# Patient Record
Sex: Female | Born: 1995 | Race: Black or African American | Hispanic: No | Marital: Single | Smoking: Never smoker
Health system: Southern US, Community
[De-identification: ages and names within clinical notes are randomized; demographics above are authoritative.]

## PROBLEM LIST (undated history)

## (undated) HISTORY — PX: OTHER SURGICAL HISTORY: SHX169

---

## 2017-03-01 ENCOUNTER — Emergency Department (HOSPITAL_COMMUNITY)
Admission: EM | Admit: 2017-03-01 | Discharge: 2017-03-01 | Disposition: A | Discharge status: Home / Self Care | Payer: BLUE CROSS/BLUE SHIELD | Attending: Emergency Medicine | Admitting: Emergency Medicine

## 2017-03-01 ENCOUNTER — Emergency Department (HOSPITAL_COMMUNITY): Payer: BLUE CROSS/BLUE SHIELD

## 2017-03-01 ENCOUNTER — Other Ambulatory Visit: Payer: Self-pay

## 2017-03-01 ENCOUNTER — Encounter (HOSPITAL_COMMUNITY): Payer: Self-pay | Admitting: *Deleted

## 2017-03-01 DIAGNOSIS — M79645 Pain in left finger(s): Secondary | ICD-10-CM | POA: Diagnosis not present

## 2017-03-01 DIAGNOSIS — M25561 Pain in right knee: Secondary | ICD-10-CM | POA: Diagnosis not present

## 2017-03-01 DIAGNOSIS — Y92410 Unspecified street and highway as the place of occurrence of the external cause: Secondary | ICD-10-CM | POA: Insufficient documentation

## 2017-03-01 DIAGNOSIS — Y999 Unspecified external cause status: Secondary | ICD-10-CM | POA: Insufficient documentation

## 2017-03-01 DIAGNOSIS — M542 Cervicalgia: Secondary | ICD-10-CM | POA: Insufficient documentation

## 2017-03-01 DIAGNOSIS — Y9389 Activity, other specified: Secondary | ICD-10-CM | POA: Insufficient documentation

## 2017-03-01 DIAGNOSIS — S199XXA Unspecified injury of neck, initial encounter: Secondary | ICD-10-CM | POA: Diagnosis present

## 2017-03-01 DIAGNOSIS — R51 Headache: Secondary | ICD-10-CM | POA: Insufficient documentation

## 2017-03-01 LAB — POC URINE PREG, ED: Preg Test, Ur: NEGATIVE

## 2017-03-01 MED ORDER — IBUPROFEN 600 MG PO TABS: 600.0000 mg | ORAL_TABLET | Freq: Four times a day (QID) | ORAL | 0 refills | Status: AC | PRN

## 2017-03-01 MED ORDER — CYCLOBENZAPRINE HCL 5 MG PO TABS: 5.0000 mg | ORAL_TABLET | Freq: Two times a day (BID) | ORAL | 0 refills | Status: AC | PRN

## 2017-03-01 MED ORDER — ACETAMINOPHEN 325 MG PO TABS
650.0000 mg | ORAL_TABLET | Freq: Once | ORAL | Status: AC
  Administered 2017-03-01: 650 mg via ORAL
  Filled 2017-03-01: qty 2

## 2017-03-01 NOTE — ED Notes (Signed)
Pt placed in c-collar
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
## 2017-03-01 NOTE — ED Notes (Signed)
Patient transported to X-ray 
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
## 2019-05-07 ENCOUNTER — Ambulatory Visit: Payer: Medicaid Other | Attending: Internal Medicine

## 2019-05-07 DIAGNOSIS — Z20822 Contact with and (suspected) exposure to covid-19: Secondary | ICD-10-CM

## 2019-05-08 LAB — NOVEL CORONAVIRUS, NAA: SARS-CoV-2, NAA: NOT DETECTED

## 2019-05-08 LAB — SARS-COV-2, NAA 2 DAY TAT
